# Patient Record
Sex: Male | Born: 1960 | Race: White | Hispanic: No | Marital: Married | Smoking: Former smoker
Health system: Southern US, Community
[De-identification: ages and names within clinical notes are randomized; demographics above are authoritative.]

---

## 2014-03-22 ENCOUNTER — Encounter (HOSPITAL_COMMUNITY): Payer: Self-pay | Admitting: Emergency Medicine

## 2014-03-22 ENCOUNTER — Emergency Department (HOSPITAL_COMMUNITY)
Admission: EM | Admit: 2014-03-22 | Discharge: 2014-03-22 | Disposition: A | Discharge status: Home / Self Care | Payer: BC Managed Care – PPO | Attending: Emergency Medicine | Admitting: Emergency Medicine

## 2014-03-22 DIAGNOSIS — S0591XA Unspecified injury of right eye and orbit, initial encounter: Secondary | ICD-10-CM | POA: Diagnosis present

## 2014-03-22 DIAGNOSIS — W541XXA Struck by dog, initial encounter: Secondary | ICD-10-CM | POA: Diagnosis not present

## 2014-03-22 DIAGNOSIS — Y9389 Activity, other specified: Secondary | ICD-10-CM | POA: Diagnosis not present

## 2014-03-22 DIAGNOSIS — Y9289 Other specified places as the place of occurrence of the external cause: Secondary | ICD-10-CM | POA: Insufficient documentation

## 2014-03-22 DIAGNOSIS — S0501XA Injury of conjunctiva and corneal abrasion without foreign body, right eye, initial encounter: Secondary | ICD-10-CM | POA: Insufficient documentation

## 2014-03-22 DIAGNOSIS — Z87891 Personal history of nicotine dependence: Secondary | ICD-10-CM | POA: Diagnosis not present

## 2014-03-22 DIAGNOSIS — Z79899 Other long term (current) drug therapy: Secondary | ICD-10-CM | POA: Insufficient documentation

## 2014-03-22 MED ORDER — FLUORESCEIN SODIUM 1 MG OP STRP
1.0000 | ORAL_STRIP | Freq: Once | OPHTHALMIC | Status: AC
  Administered 2014-03-22: 1 via OPHTHALMIC

## 2014-03-22 MED ORDER — TETRACAINE HCL 0.5 % OP SOLN
1.0000 [drp] | Freq: Once | OPHTHALMIC | Status: AC
  Administered 2014-03-22: 2 [drp] via OPHTHALMIC
  Filled 2014-03-22: qty 2

## 2014-03-22 MED ORDER — POLYMYXIN B-TRIMETHOPRIM 10000-0.1 UNIT/ML-% OP SOLN
1.0000 [drp] | Freq: Four times a day (QID) | OPHTHALMIC | Status: DC
  Filled 2014-03-22: qty 10

## 2014-03-22 MED ORDER — POLYMYXIN B-TRIMETHOPRIM 10000-0.1 UNIT/ML-% OP SOLN: 1.0000 [drp] | Freq: Four times a day (QID) | OPHTHALMIC | Status: AC

## 2014-03-22 NOTE — ED Provider Notes (Signed)
CSN: [id number]     Arrival date & time 03/22/14  1941 History  This chart was scribed for a non-physician practitioner, Baron Sane, PA-C working with Dorie Rank, MD by Martinique Peace, ED Scribe. The patient was seen in [facility]. The patient's care was started at 8:30 PM.    Chief Complaint  Patient presents with  . Eye Pain      Patient is a 53 y.o. male presenting with eye pain. The history is provided by the patient. No language interpreter was used.  Eye Pain   HPI Comments: Gerald Alvarado is a 53 y.o. male who presents to the Emergency Department complaining of scratch to right eye onset a few hours ago that pt sustained while playing with his dog. He states that the dog was close to his face and lunged his face. He adds that he went into the bathroom afterwards to check eye and reports seeing a "hole" on it. No complaints of eye discharge or changes in vision. He states that he does usually wear glasses but denies contacts usage. He further denies any history of eye problems. Pt reports he does have eye doctor that he follows up with regularly. Pt is up to date with Tetanus vaccination.   No past medical history on file. No past surgical history on file. History reviewed. No pertinent family history. History  Substance Use Topics  . Smoking status: Former Research scientist (life sciences)  . Smokeless tobacco: Never Used  . Alcohol Use: Yes    Review of Systems  Eyes: Positive for pain. Negative for discharge and visual disturbance.  All other systems reviewed and are negative.     Allergies  Sulfa antibiotics  Home Medications   Prior to Admission medications   Medication Sig Start Date End Date Taking? Authorizing Provider  finasteride (PROSCAR) 5 MG tablet Take 5 mg by mouth daily.   Yes Historical Provider, MD  ibuprofen (ADVIL,MOTRIN) 200 MG tablet Take 400 mg by mouth every 6 (six) hours as needed for moderate pain.   Yes Historical Provider, MD  Multiple Vitamin (MULTIVITAMIN WITH  MINERALS) TABS tablet Take 1 tablet by mouth daily.   Yes Historical Provider, MD  vitamin C (ASCORBIC ACID) 500 MG tablet Take 500 mg by mouth 2 (two) times daily.   Yes Historical Provider, MD  trimethoprim-polymyxin b (POLYTRIM) ophthalmic solution Place 1 drop into the right eye every 6 (six) hours. X 5 days 03/22/14   Tesia Lybrand L Rece Zechman, PA-C   BP 158/91 mmHg  Pulse 63  Temp(Src) 98.5 F (36.9 C) (Oral)  Resp 18  SpO2 98% Physical Exam  Constitutional: He is oriented to person, place, and time. He appears well-developed and well-nourished. No distress.  HENT:  Head: Normocephalic and atraumatic.  Right Ear: External ear normal.  Left Ear: External ear normal.  Nose: Nose normal.  Mouth/Throat: Oropharynx is clear and moist. No oropharyngeal exudate.  Eyes: Conjunctivae, EOM and lids are normal. Pupils are equal, round, and reactive to light. Lids are everted and swept, no foreign bodies found. Right eye exhibits no discharge. Left eye exhibits no discharge. No scleral icterus.  Fundoscopic exam:      The right eye shows no hemorrhage and no papilledema. The right eye shows red reflex.       The left eye shows no hemorrhage and no papilledema. The left eye shows red reflex.  Slit lamp exam:      The right eye shows corneal abrasion and fluorescein uptake. The right eye shows no  corneal flare, no corneal ulcer, no foreign body, no hyphema and no hypopyon.    Visual Acuity - Bilateral Distance: 20/40 ; R Distance: 20/70 ; L Distance: 20/40  R Eye Pressure 72mmHg  Neck: Normal range of motion. Neck supple. No tracheal deviation present.  Cardiovascular: Normal rate, regular rhythm and normal heart sounds.   Pulmonary/Chest: Effort normal and breath sounds normal. No respiratory distress.  Abdominal: Soft. There is no tenderness.  Musculoskeletal: Normal range of motion.  Neurological: He is alert and oriented to person, place, and time.  Skin: Skin is warm and dry. He is not  diaphoretic.  Psychiatric: He has a normal mood and affect. His behavior is normal.  Nursing note and vitals reviewed.   ED Course  Procedures (including critical care time) Medications  tetracaine (PONTOCAINE) 0.5 % ophthalmic solution 1-2 drop (2 drops Right Eye Given by Other 03/22/14 2122)  fluorescein ophthalmic strip 1 strip (1 strip Right Eye Given 03/22/14 2122)    Labs Review Labs Reviewed - No data to display  Imaging Review No results found.   EKG Interpretation None     Medications  tetracaine (PONTOCAINE) 0.5 % ophthalmic solution 1-2 drop (2 drops Right Eye Given by Other 03/22/14 2122)  fluorescein ophthalmic strip 1 strip (1 strip Right Eye Given 03/22/14 2122)   8:35 PM- Treatment plan was discussed with patient who verbalizes understanding and agrees.   MDM   Final diagnoses:  Corneal abrasion, right, initial encounter    Filed Vitals:   03/22/14 2001  BP: 158/91  Pulse: 63  Temp: 98.5 F (36.9 C)  Resp: 18   Afebrile, NAD, non-toxic appearing, AAOx4. Pt with corneal abrasion on PE. Tdap UTD. Eye irrigated w NS, no evidence of FB.  No change in vision, acuity equal bilaterally.  Pt is not a contact lens wearer.  Exam non-concerning for orbital cellulitis, hyphema, corneal ulcers. Patient will be discharged home with polyrim.   Patient understands to follow up with ophthalmology, & to return to ER if new symptoms develop including change in vision, purulent drainage, or entrapment. Patient is stable at time of discharge.    I personally performed the services described in this documentation, which was scribed in my presence. The recorded information has been reviewed and is accurate.   Harlow Mares, PA-C 03/22/14 2139  Dorie Rank, MD 03/22/14 [id number]

## 2014-03-22 NOTE — Discharge Instructions (Signed)
Please follow up with your primary care physician in 1-2 days. If you do not have one please call the [place] number listed above. Please follow up with your eye doctor or Dr. Valetta Close to schedule a follow up appointment.  Please use the antibiotic drops for five days. Please read all discharge instructions and return precautions.    Corneal Abrasion The cornea is the clear covering at the front and center of the eye. When looking at the colored portion of the eye (iris), you are looking through the cornea. This very thin tissue is made up of many layers. The surface layer is a single layer of cells (corneal epithelium) and is one of the most sensitive tissues in the body. If a scratch or injury causes the corneal epithelium to come off, it is called a corneal abrasion. If the injury extends to the tissues below the epithelium, the condition is called a corneal ulcer. CAUSES   Scratches.  Trauma.  Foreign body in the eye. Some people have recurrences of abrasions in the area of the original injury even after it has healed (recurrent erosion syndrome). Recurrent erosion syndrome generally improves and goes away with time. SYMPTOMS   Eye pain.  Difficulty or inability to keep the injured eye open.  The eye becomes very sensitive to light.  Recurrent erosions tend to happen suddenly, first thing in the morning, usually after waking up and opening the eye. DIAGNOSIS  Your health care provider can diagnose a corneal abrasion during an eye exam. Dye is usually placed in the eye using a drop or a small paper strip moistened by your tears. When the eye is examined with a special light, the abrasion shows up clearly because of the dye. TREATMENT   Small abrasions may be treated with antibiotic drops or ointment alone.  A pressure patch may be put over the eye. If this is done, follow your doctor's instructions for when to remove the patch. Do not drive or use machines while  the eye patch is on. Judging distances is hard to do with a patch on. If the abrasion becomes infected and spreads to the deeper tissues of the cornea, a corneal ulcer can result. This is serious because it can cause corneal scarring. Corneal scars interfere with light passing through the cornea and cause a loss of vision in the involved eye. HOME CARE INSTRUCTIONS  Use medicine or ointment as directed. Only take over-the-counter or prescription medicines for pain, discomfort, or fever as directed by your health care provider.  Do not drive or operate machinery if your eye is patched. Your ability to judge distances is impaired.  If your health care provider has given you a follow-up appointment, it is very important to keep that appointment. Not keeping the appointment could result in a severe eye infection or permanent loss of vision. If there is any problem keeping the appointment, let your health care provider know. SEEK MEDICAL CARE IF:   You have pain, light sensitivity, and a scratchy feeling in one eye or both eyes.  Your pressure patch keeps loosening up, and you can blink your eye under the patch after treatment.  Any kind of discharge develops from the eye after treatment or if the lids stick together in the morning.  You have the same symptoms in the morning as you did with the original abrasion days, weeks, or months after the abrasion healed. MAKE SURE YOU:   Understand these instructions.  Will watch  your condition.  Will get help right away if you are not doing well or get worse. Document Released: 04/30/2000 Document Revised: 05/08/2013 Document Reviewed: 01/08/2013 [facility] Patient Information 2015 [place], [place]. This information is not intended to replace advice given to you by your health care provider. Make sure you discuss any questions you have with your health care provider.

## 2014-03-22 NOTE — ED Notes (Signed)
Pt arrived to the ED with a complaint of right eye pain.  Pt states that he was scratched by a small dog.  Pt denies any visual disturbance.  Pt has a small scratch medially.

## 2016-08-12 DIAGNOSIS — H524 Presbyopia: Secondary | ICD-10-CM | POA: Diagnosis not present

## 2016-08-12 DIAGNOSIS — H5213 Myopia, bilateral: Secondary | ICD-10-CM | POA: Diagnosis not present

## 2016-08-12 DIAGNOSIS — H52223 Regular astigmatism, bilateral: Secondary | ICD-10-CM | POA: Diagnosis not present

## 2016-09-22 DIAGNOSIS — D2262 Melanocytic nevi of left upper limb, including shoulder: Secondary | ICD-10-CM | POA: Diagnosis not present

## 2016-09-22 DIAGNOSIS — Z0001 Encounter for general adult medical examination with abnormal findings: Secondary | ICD-10-CM | POA: Diagnosis not present

## 2016-09-22 DIAGNOSIS — L659 Nonscarring hair loss, unspecified: Secondary | ICD-10-CM | POA: Diagnosis not present

## 2016-09-22 DIAGNOSIS — Z13220 Encounter for screening for lipoid disorders: Secondary | ICD-10-CM | POA: Diagnosis not present

## 2016-09-22 DIAGNOSIS — R7301 Impaired fasting glucose: Secondary | ICD-10-CM | POA: Diagnosis not present

## 2016-10-06 DIAGNOSIS — D499 Neoplasm of unspecified behavior of unspecified site: Secondary | ICD-10-CM | POA: Diagnosis not present

## 2016-10-06 DIAGNOSIS — D0362 Melanoma in situ of left upper limb, including shoulder: Secondary | ICD-10-CM | POA: Diagnosis not present

## 2016-10-20 DIAGNOSIS — D0362 Melanoma in situ of left upper limb, including shoulder: Secondary | ICD-10-CM | POA: Diagnosis not present

## 2016-10-20 DIAGNOSIS — D0361 Melanoma in situ of right upper limb, including shoulder: Secondary | ICD-10-CM | POA: Diagnosis not present

## 2016-12-08 DIAGNOSIS — D235 Other benign neoplasm of skin of trunk: Secondary | ICD-10-CM | POA: Diagnosis not present

## 2016-12-08 DIAGNOSIS — D225 Melanocytic nevi of trunk: Secondary | ICD-10-CM | POA: Diagnosis not present

## 2016-12-08 DIAGNOSIS — D485 Neoplasm of uncertain behavior of skin: Secondary | ICD-10-CM | POA: Diagnosis not present

## 2016-12-08 DIAGNOSIS — Z85820 Personal history of malignant melanoma of skin: Secondary | ICD-10-CM | POA: Diagnosis not present

## 2016-12-08 DIAGNOSIS — L821 Other seborrheic keratosis: Secondary | ICD-10-CM | POA: Diagnosis not present

## 2017-02-15 DIAGNOSIS — Z23 Encounter for immunization: Secondary | ICD-10-CM | POA: Diagnosis not present

## 2017-02-16 ENCOUNTER — Other Ambulatory Visit: Payer: Self-pay | Admitting: Family Medicine

## 2017-02-16 ENCOUNTER — Ambulatory Visit
Admission: RE | Admit: 2017-02-16 | Discharge: 2017-02-16 | Disposition: A | Discharge status: Home / Self Care | Payer: Self-pay | Source: Ambulatory Visit | Attending: Family Medicine | Admitting: Family Medicine

## 2017-02-16 DIAGNOSIS — M79671 Pain in right foot: Secondary | ICD-10-CM

## 2017-02-28 DIAGNOSIS — M79671 Pain in right foot: Secondary | ICD-10-CM | POA: Diagnosis not present

## 2017-02-28 DIAGNOSIS — M2021 Hallux rigidus, right foot: Secondary | ICD-10-CM | POA: Diagnosis not present

## 2017-02-28 DIAGNOSIS — M722 Plantar fascial fibromatosis: Secondary | ICD-10-CM | POA: Diagnosis not present

## 2017-02-28 DIAGNOSIS — M71571 Other bursitis, not elsewhere classified, right ankle and foot: Secondary | ICD-10-CM | POA: Diagnosis not present

## 2017-03-14 DIAGNOSIS — M722 Plantar fascial fibromatosis: Secondary | ICD-10-CM | POA: Diagnosis not present

## 2017-03-14 DIAGNOSIS — M71571 Other bursitis, not elsewhere classified, right ankle and foot: Secondary | ICD-10-CM | POA: Diagnosis not present

## 2017-04-04 DIAGNOSIS — K5730 Diverticulosis of large intestine without perforation or abscess without bleeding: Secondary | ICD-10-CM | POA: Diagnosis not present

## 2017-04-04 DIAGNOSIS — Z86010 Personal history of colonic polyps: Secondary | ICD-10-CM | POA: Diagnosis not present

## 2017-06-15 DIAGNOSIS — L821 Other seborrheic keratosis: Secondary | ICD-10-CM | POA: Diagnosis not present

## 2017-06-15 DIAGNOSIS — L570 Actinic keratosis: Secondary | ICD-10-CM | POA: Diagnosis not present

## 2017-06-15 DIAGNOSIS — D225 Melanocytic nevi of trunk: Secondary | ICD-10-CM | POA: Diagnosis not present

## 2017-06-15 DIAGNOSIS — L814 Other melanin hyperpigmentation: Secondary | ICD-10-CM | POA: Diagnosis not present

## 2017-06-27 DIAGNOSIS — R05 Cough: Secondary | ICD-10-CM | POA: Diagnosis not present

## 2017-06-27 DIAGNOSIS — R0981 Nasal congestion: Secondary | ICD-10-CM | POA: Diagnosis not present

## 2017-12-07 DIAGNOSIS — Z85820 Personal history of malignant melanoma of skin: Secondary | ICD-10-CM | POA: Diagnosis not present

## 2017-12-07 DIAGNOSIS — D2261 Melanocytic nevi of right upper limb, including shoulder: Secondary | ICD-10-CM | POA: Diagnosis not present

## 2017-12-07 DIAGNOSIS — D2262 Melanocytic nevi of left upper limb, including shoulder: Secondary | ICD-10-CM | POA: Diagnosis not present

## 2017-12-26 DIAGNOSIS — L720 Epidermal cyst: Secondary | ICD-10-CM | POA: Diagnosis not present

## 2017-12-26 DIAGNOSIS — L659 Nonscarring hair loss, unspecified: Secondary | ICD-10-CM | POA: Diagnosis not present

## 2018-04-21 DIAGNOSIS — Z125 Encounter for screening for malignant neoplasm of prostate: Secondary | ICD-10-CM | POA: Diagnosis not present

## 2018-04-21 DIAGNOSIS — L659 Nonscarring hair loss, unspecified: Secondary | ICD-10-CM | POA: Diagnosis not present

## 2018-04-21 DIAGNOSIS — R7303 Prediabetes: Secondary | ICD-10-CM | POA: Diagnosis not present

## 2018-04-21 DIAGNOSIS — Z0000 Encounter for general adult medical examination without abnormal findings: Secondary | ICD-10-CM | POA: Diagnosis not present

## 2018-04-21 DIAGNOSIS — Z13220 Encounter for screening for lipoid disorders: Secondary | ICD-10-CM | POA: Diagnosis not present

## 2019-09-02 IMAGING — CR DG FOOT COMPLETE 3+V*R*
3 series · 3 of 3 positions shown · non-contrast
Comparison: None.

CLINICAL DATA: 56-year-old male with a history of right plantar
heel pain

EXAM:
RIGHT FOOT COMPLETE - 3+ VIEW

[x foot ap right]
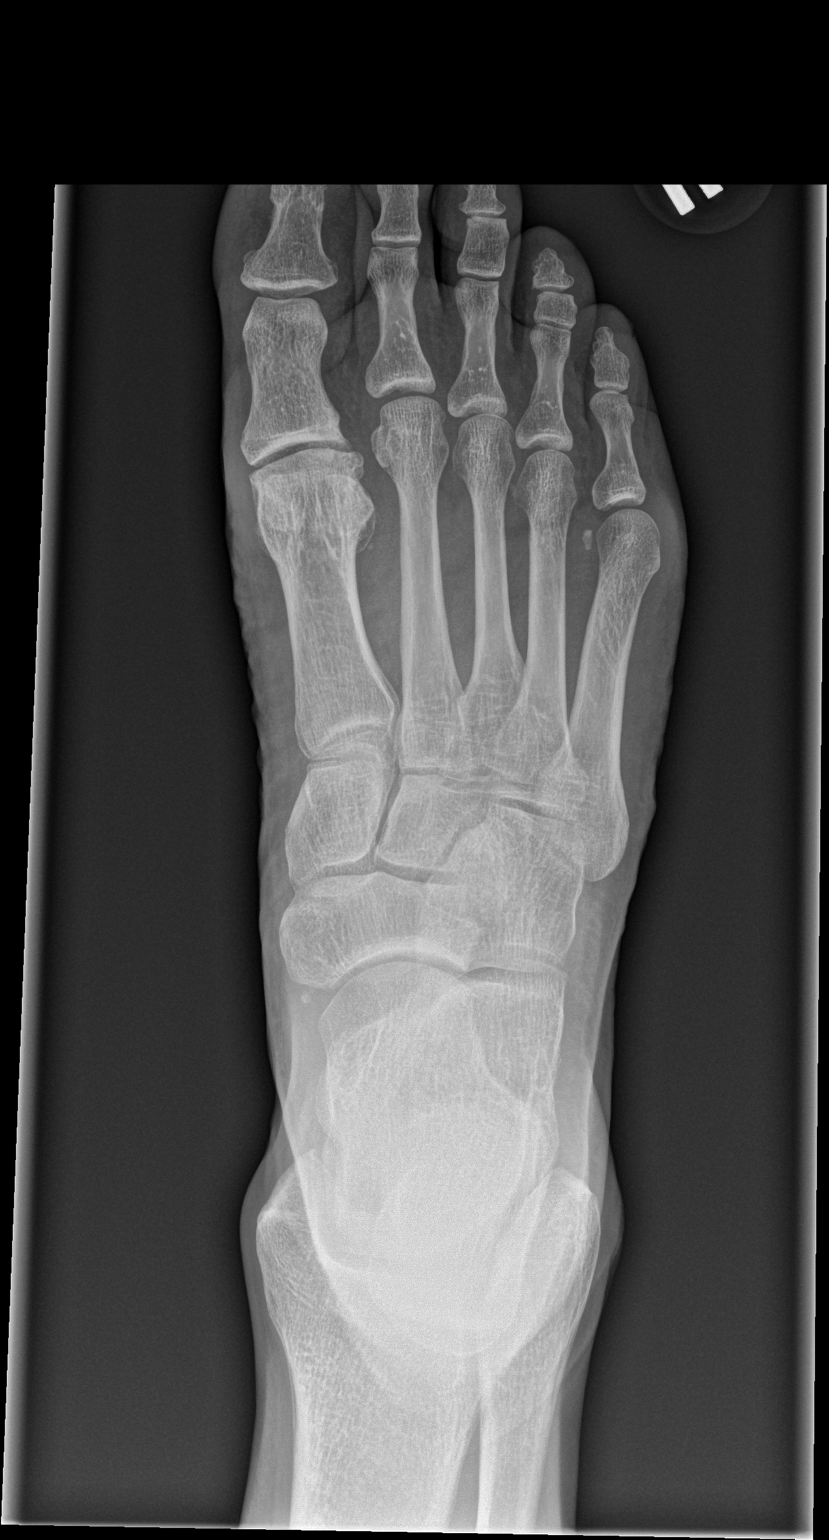

[x foot obl right]
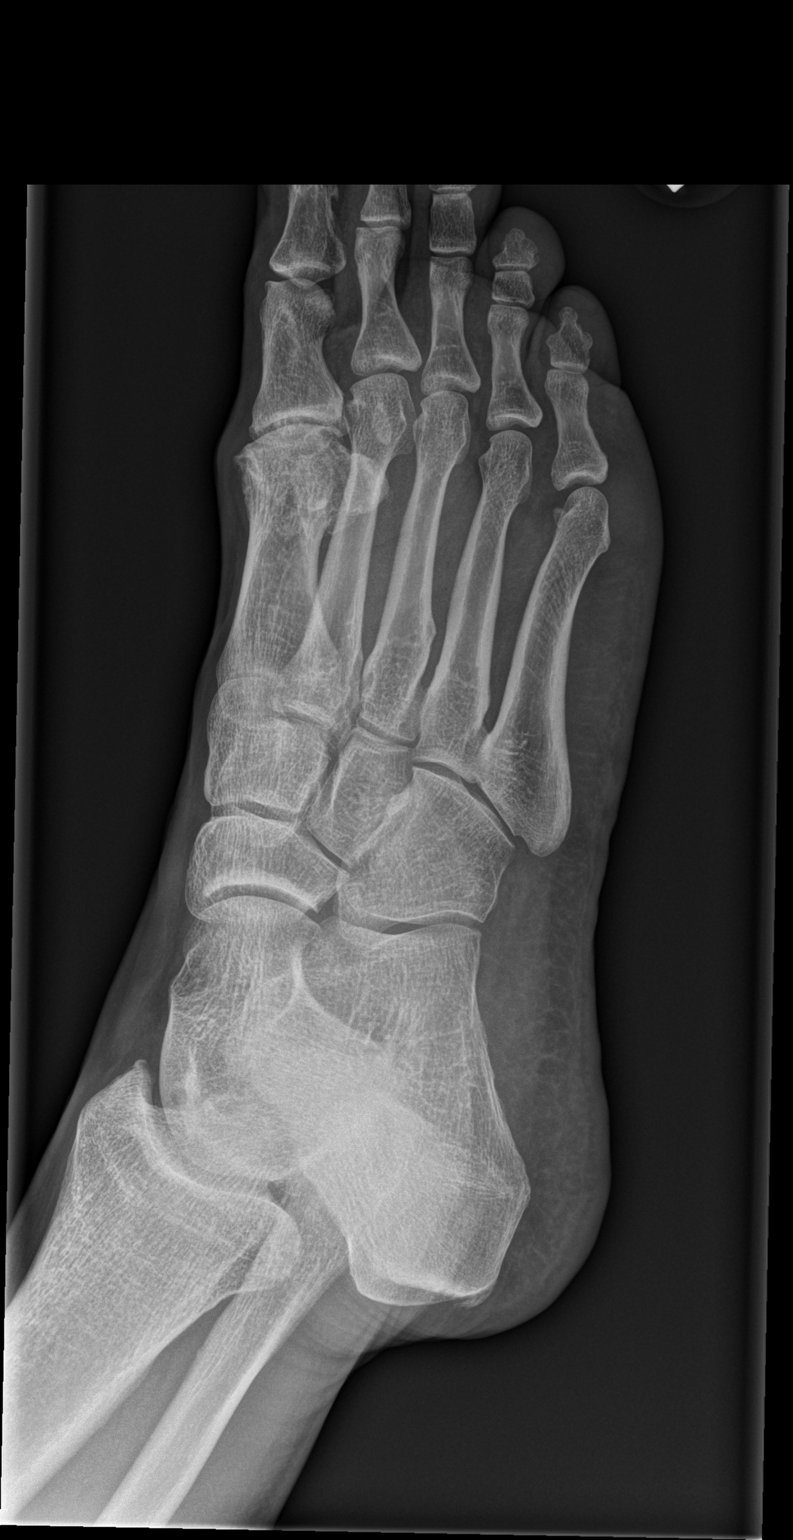

[x foot lat right]
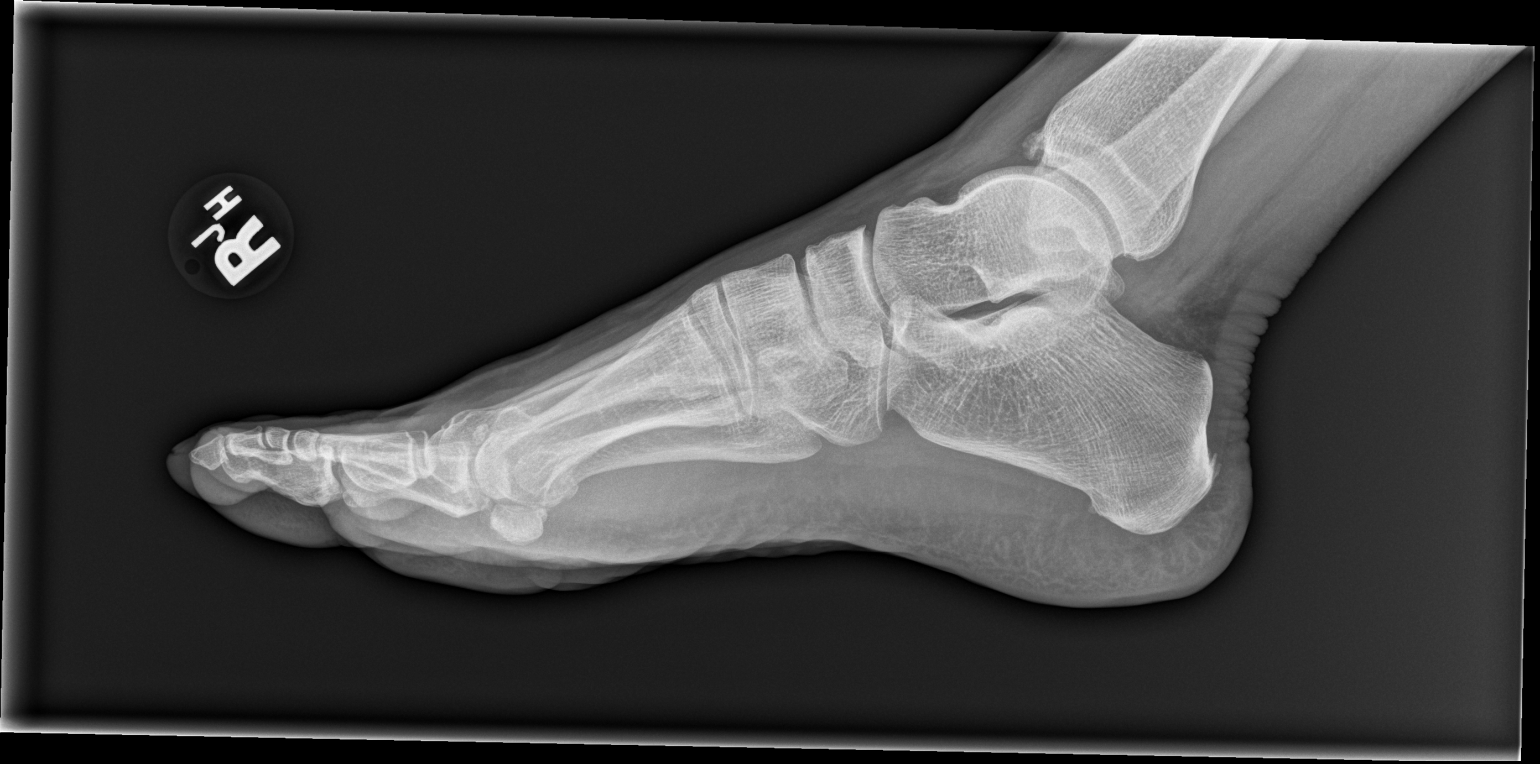

[3 of 3 positions shown; findings below may reference images not displayed]

FINDINGS: No acute fracture identified. Degenerative changes of the
interphalangeal joints, midfoot, hindfoot. Enthesopathic changes at
the insertion of the Achilles, with minimal spurring at the
insertion of the plantar fascia.

No focal soft tissue swelling.  No joint effusion.
IMPRESSION: No acute bony abnormality.

Enthesopathic changes at the Achilles insertion on the calcaneus,
with mild spurring at the insertion of the plantar fascia.
